# Patient Record
Sex: Female | Born: 1990 | Race: Black or African American | Hispanic: No | Marital: Single | Smoking: Current every day smoker
Health system: Southern US, Community
[De-identification: ages and names within clinical notes are randomized; demographics above are authoritative.]

---
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
## 2016-06-21 ENCOUNTER — Emergency Department (HOSPITAL_COMMUNITY): Payer: BLUE CROSS/BLUE SHIELD

## 2016-06-21 ENCOUNTER — Emergency Department (HOSPITAL_COMMUNITY)
Admission: EM | Admit: 2016-06-21 | Discharge: 2016-06-21 | Disposition: A | Discharge status: Home / Self Care | Payer: BLUE CROSS/BLUE SHIELD | Attending: Emergency Medicine | Admitting: Emergency Medicine

## 2016-06-21 ENCOUNTER — Encounter (HOSPITAL_COMMUNITY): Payer: Self-pay | Admitting: *Deleted

## 2016-06-21 DIAGNOSIS — Y999 Unspecified external cause status: Secondary | ICD-10-CM | POA: Diagnosis not present

## 2016-06-21 DIAGNOSIS — M546 Pain in thoracic spine: Secondary | ICD-10-CM | POA: Insufficient documentation

## 2016-06-21 DIAGNOSIS — Y92410 Unspecified street and highway as the place of occurrence of the external cause: Secondary | ICD-10-CM | POA: Insufficient documentation

## 2016-06-21 DIAGNOSIS — S8012XA Contusion of left lower leg, initial encounter: Secondary | ICD-10-CM | POA: Insufficient documentation

## 2016-06-21 DIAGNOSIS — Y939 Activity, unspecified: Secondary | ICD-10-CM | POA: Insufficient documentation

## 2016-06-21 DIAGNOSIS — S8992XA Unspecified injury of left lower leg, initial encounter: Secondary | ICD-10-CM | POA: Diagnosis present

## 2016-06-21 MED ORDER — OXYCODONE-ACETAMINOPHEN 5-325 MG PO TABS
2.0000 | ORAL_TABLET | Freq: Once | ORAL | Status: AC
  Administered 2016-06-21: 2 via ORAL
  Filled 2016-06-21: qty 2
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
## 2016-06-21 NOTE — Discharge Instructions (Signed)
Please take ibuprofen 600 mg three times per day for 2-4 days.  Recheck with your doctor this week if symptoms have not resolved.
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
## 2016-06-23 ENCOUNTER — Emergency Department (HOSPITAL_COMMUNITY): Payer: BLUE CROSS/BLUE SHIELD

## 2016-06-23 ENCOUNTER — Emergency Department (HOSPITAL_COMMUNITY)
Admission: EM | Admit: 2016-06-23 | Discharge: 2016-06-23 | Disposition: A | Discharge status: Home / Self Care | Payer: BLUE CROSS/BLUE SHIELD | Attending: Emergency Medicine | Admitting: Emergency Medicine

## 2016-06-23 ENCOUNTER — Encounter (HOSPITAL_COMMUNITY): Payer: Self-pay | Admitting: Emergency Medicine

## 2016-06-23 DIAGNOSIS — M25511 Pain in right shoulder: Secondary | ICD-10-CM | POA: Insufficient documentation

## 2016-06-23 DIAGNOSIS — S7012XA Contusion of left thigh, initial encounter: Secondary | ICD-10-CM | POA: Insufficient documentation

## 2016-06-23 DIAGNOSIS — Y92410 Unspecified street and highway as the place of occurrence of the external cause: Secondary | ICD-10-CM | POA: Diagnosis not present

## 2016-06-23 DIAGNOSIS — Y999 Unspecified external cause status: Secondary | ICD-10-CM | POA: Diagnosis not present

## 2016-06-23 DIAGNOSIS — S7010XA Contusion of unspecified thigh, initial encounter: Secondary | ICD-10-CM

## 2016-06-23 DIAGNOSIS — Y939 Activity, unspecified: Secondary | ICD-10-CM | POA: Diagnosis not present

## 2016-06-23 DIAGNOSIS — M542 Cervicalgia: Secondary | ICD-10-CM | POA: Insufficient documentation

## 2016-06-23 DIAGNOSIS — S79922A Unspecified injury of left thigh, initial encounter: Secondary | ICD-10-CM | POA: Diagnosis present

## 2016-06-23 DIAGNOSIS — S8010XA Contusion of unspecified lower leg, initial encounter: Secondary | ICD-10-CM

## 2016-06-23 LAB — POC URINE PREG, ED: Preg Test, Ur: NEGATIVE

## 2016-06-23 MED ORDER — NAPROXEN 375 MG PO TABS: 375.0000 mg | ORAL_TABLET | Freq: Two times a day (BID) | ORAL | 0 refills | Status: DC

## 2016-06-23 MED ORDER — METHOCARBAMOL 500 MG PO TABS: 500.0000 mg | ORAL_TABLET | Freq: Two times a day (BID) | ORAL | 0 refills | Status: DC
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
## 2016-06-23 NOTE — ED Notes (Signed)
See EDP's secondary assessment.
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
## 2016-06-23 NOTE — ED Notes (Signed)
Patient transported to X-ray 

## 2016-07-21 ENCOUNTER — Ambulatory Visit (HOSPITAL_COMMUNITY)
Admission: EM | Admit: 2016-07-21 | Discharge: 2016-07-21 | Disposition: A | Discharge status: Home / Self Care | Payer: BLUE CROSS/BLUE SHIELD | Attending: Family Medicine | Admitting: Family Medicine

## 2016-07-21 ENCOUNTER — Encounter (HOSPITAL_COMMUNITY): Payer: Self-pay | Admitting: Emergency Medicine

## 2016-07-21 DIAGNOSIS — M546 Pain in thoracic spine: Secondary | ICD-10-CM | POA: Diagnosis not present

## 2016-07-21 MED ORDER — METHOCARBAMOL 500 MG PO TABS: 500.0000 mg | ORAL_TABLET | Freq: Two times a day (BID) | ORAL | 0 refills | Status: DC

## 2016-07-21 MED ORDER — PREDNISONE 10 MG (21) PO TBPK: ORAL_TABLET | Freq: Every day | ORAL | 0 refills | Status: DC
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
## 2016-07-21 NOTE — Discharge Instructions (Signed)
Based on your signs, symptoms, and physical exam findings,you may at some point and near future need an MRI. For your condition, this can only be ordered either by a primary care provider, an orthopedist, or a neurologist. I provided the contact information for [facility], I recommend you contact them in the morning to schedule an appointment for primary care. You may also follow up with an orthopedist as well for your pain.

## 2017-10-29 ENCOUNTER — Other Ambulatory Visit: Payer: Self-pay

## 2017-10-29 ENCOUNTER — Encounter (HOSPITAL_COMMUNITY): Payer: Self-pay | Admitting: Emergency Medicine

## 2017-10-29 ENCOUNTER — Emergency Department (HOSPITAL_COMMUNITY): Payer: No Typology Code available for payment source

## 2017-10-29 ENCOUNTER — Emergency Department (HOSPITAL_COMMUNITY)
Admission: EM | Admit: 2017-10-29 | Discharge: 2017-10-30 | Disposition: A | Discharge status: Home / Self Care | Payer: No Typology Code available for payment source | Attending: Emergency Medicine | Admitting: Emergency Medicine

## 2017-10-29 DIAGNOSIS — Y939 Activity, unspecified: Secondary | ICD-10-CM | POA: Diagnosis not present

## 2017-10-29 DIAGNOSIS — S161XXA Strain of muscle, fascia and tendon at neck level, initial encounter: Secondary | ICD-10-CM | POA: Diagnosis not present

## 2017-10-29 DIAGNOSIS — R51 Headache: Secondary | ICD-10-CM | POA: Diagnosis present

## 2017-10-29 DIAGNOSIS — S93402A Sprain of unspecified ligament of left ankle, initial encounter: Secondary | ICD-10-CM | POA: Diagnosis not present

## 2017-10-29 DIAGNOSIS — Y999 Unspecified external cause status: Secondary | ICD-10-CM | POA: Diagnosis not present

## 2017-10-29 DIAGNOSIS — M549 Dorsalgia, unspecified: Secondary | ICD-10-CM | POA: Diagnosis not present

## 2017-10-29 DIAGNOSIS — Y929 Unspecified place or not applicable: Secondary | ICD-10-CM | POA: Diagnosis not present

## 2017-10-29 MED ORDER — ACETAMINOPHEN 325 MG PO TABS
650.0000 mg | ORAL_TABLET | Freq: Once | ORAL | Status: AC
  Administered 2017-10-30: 650 mg via ORAL
  Filled 2017-10-29: qty 2

## 2017-10-29 MED ORDER — METHOCARBAMOL 500 MG PO TABS
500.0000 mg | ORAL_TABLET | Freq: Once | ORAL | Status: AC
  Administered 2017-10-30: 500 mg via ORAL
  Filled 2017-10-29: qty 1
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
## 2017-10-29 NOTE — ED Notes (Signed)
Patient transported to CT 

## 2017-10-30 MED ORDER — ACETAMINOPHEN 325 MG PO TABS
650.0000 mg | ORAL_TABLET | Freq: Four times a day (QID) | ORAL | 0 refills | Status: AC | PRN
Start: 2017-10-30 — End: ?

## 2017-10-30 MED ORDER — METHOCARBAMOL 500 MG PO TABS: 500.0000 mg | ORAL_TABLET | Freq: Two times a day (BID) | ORAL | 0 refills | Status: DC
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
## 2018-10-28 ENCOUNTER — Other Ambulatory Visit: Payer: Self-pay

## 2018-10-28 DIAGNOSIS — Z20822 Contact with and (suspected) exposure to covid-19: Secondary | ICD-10-CM

## 2018-10-29 LAB — NOVEL CORONAVIRUS, NAA: SARS-CoV-2, NAA: NOT DETECTED

## 2020-12-04 DIAGNOSIS — F17210 Nicotine dependence, cigarettes, uncomplicated: Secondary | ICD-10-CM | POA: Diagnosis not present

## 2020-12-04 DIAGNOSIS — S301XXA Contusion of abdominal wall, initial encounter: Secondary | ICD-10-CM | POA: Diagnosis not present

## 2020-12-04 DIAGNOSIS — Y92410 Unspecified street and highway as the place of occurrence of the external cause: Secondary | ICD-10-CM | POA: Insufficient documentation

## 2020-12-04 DIAGNOSIS — M25512 Pain in left shoulder: Secondary | ICD-10-CM | POA: Insufficient documentation

## 2020-12-04 DIAGNOSIS — S8992XA Unspecified injury of left lower leg, initial encounter: Secondary | ICD-10-CM | POA: Diagnosis present

## 2020-12-04 DIAGNOSIS — M542 Cervicalgia: Secondary | ICD-10-CM | POA: Diagnosis not present

## 2020-12-04 DIAGNOSIS — R079 Chest pain, unspecified: Secondary | ICD-10-CM | POA: Diagnosis not present

## 2020-12-04 DIAGNOSIS — N9489 Other specified conditions associated with female genital organs and menstrual cycle: Secondary | ICD-10-CM | POA: Diagnosis not present

## 2020-12-04 DIAGNOSIS — S8002XA Contusion of left knee, initial encounter: Secondary | ICD-10-CM | POA: Diagnosis not present

## 2020-12-05 ENCOUNTER — Emergency Department (HOSPITAL_COMMUNITY): Payer: No Typology Code available for payment source

## 2020-12-05 ENCOUNTER — Encounter (HOSPITAL_COMMUNITY): Payer: Self-pay | Admitting: Emergency Medicine

## 2020-12-05 ENCOUNTER — Emergency Department (HOSPITAL_COMMUNITY)
Admission: EM | Admit: 2020-12-05 | Discharge: 2020-12-05 | Disposition: A | Discharge status: Home / Self Care | Payer: No Typology Code available for payment source | Attending: Emergency Medicine | Admitting: Emergency Medicine

## 2020-12-05 DIAGNOSIS — D509 Iron deficiency anemia, unspecified: Secondary | ICD-10-CM

## 2020-12-05 DIAGNOSIS — T07XXXA Unspecified multiple injuries, initial encounter: Secondary | ICD-10-CM

## 2020-12-05 LAB — CBC WITH DIFFERENTIAL/PLATELET
Abs Immature Granulocytes: 0.04 10*3/uL (ref 0.00–0.07)
Basophils Absolute: 0.1 10*3/uL (ref 0.0–0.1)
Basophils Relative: 1 %
Eosinophils Absolute: 0.2 10*3/uL (ref 0.0–0.5)
Eosinophils Relative: 2 %
HCT: 29.1 % — ABNORMAL LOW (ref 36.0–46.0)
Hemoglobin: 8.6 g/dL — ABNORMAL LOW (ref 12.0–15.0)
Immature Granulocytes: 0 %
Lymphocytes Relative: 26 %
Lymphs Abs: 2.5 10*3/uL (ref 0.7–4.0)
MCH: 21.8 pg — ABNORMAL LOW (ref 26.0–34.0)
MCHC: 29.6 g/dL — ABNORMAL LOW (ref 30.0–36.0)
MCV: 73.7 fL — ABNORMAL LOW (ref 80.0–100.0)
Monocytes Absolute: 0.6 10*3/uL (ref 0.1–1.0)
Monocytes Relative: 6 %
Neutro Abs: 6.3 10*3/uL (ref 1.7–7.7)
Neutrophils Relative %: 65 %
Platelets: 407 10*3/uL — ABNORMAL HIGH (ref 150–400)
RBC: 3.95 MIL/uL (ref 3.87–5.11)
RDW: 27.2 % — ABNORMAL HIGH (ref 11.5–15.5)
WBC: 9.7 10*3/uL (ref 4.0–10.5)
nRBC: 0 % (ref 0.0–0.2)

## 2020-12-05 LAB — BASIC METABOLIC PANEL
Anion gap: 10 (ref 5–15)
BUN: 12 mg/dL (ref 6–20)
CO2: 24 mmol/L (ref 22–32)
Calcium: 9.4 mg/dL (ref 8.9–10.3)
Chloride: 100 mmol/L (ref 98–111)
Creatinine, Ser: 0.88 mg/dL (ref 0.44–1.00)
GFR, Estimated: >60 mL/min (ref >60)
Glucose, Bld: 104 mg/dL — ABNORMAL HIGH (ref 70–99)
Potassium: 3.7 mmol/L (ref 3.5–5.1)
Sodium: 134 mmol/L — ABNORMAL LOW (ref 135–145)

## 2020-12-05 LAB — HEMOGLOBIN AND HEMATOCRIT, BLOOD
HCT: 27.6 % — ABNORMAL LOW (ref 36.0–46.0)
Hemoglobin: 8.1 g/dL — ABNORMAL LOW (ref 12.0–15.0)

## 2020-12-05 LAB — HCG, QUANTITATIVE, PREGNANCY: hCG, Beta Chain, Quant, S: 1 m[IU]/mL (ref <5)

## 2020-12-05 MED ORDER — SODIUM CHLORIDE (PF) 0.9 % IJ SOLN
INTRAMUSCULAR | Status: AC
  Filled 2020-12-05: qty 50

## 2020-12-05 MED ORDER — MORPHINE SULFATE (PF) 4 MG/ML IV SOLN
4.0000 mg | Freq: Once | INTRAVENOUS | Status: AC
  Administered 2020-12-05: 4 mg via INTRAVENOUS
  Filled 2020-12-05: qty 1

## 2020-12-05 MED ORDER — OXYCODONE-ACETAMINOPHEN 5-325 MG PO TABS
1.0000 | ORAL_TABLET | Freq: Once | ORAL | Status: AC
  Administered 2020-12-05: 1 via ORAL
  Filled 2020-12-05: qty 1

## 2020-12-05 MED ORDER — IOHEXOL 350 MG/ML SOLN
100.0000 mL | Freq: Once | INTRAVENOUS | Status: AC | PRN
  Administered 2020-12-05: 80 mL via INTRAVENOUS

## 2020-12-05 MED ORDER — IBUPROFEN 600 MG PO TABS: 600.0000 mg | ORAL_TABLET | Freq: Four times a day (QID) | ORAL | 0 refills | Status: AC | PRN

## 2020-12-05 MED ORDER — CYCLOBENZAPRINE HCL 10 MG PO TABS: 10.0000 mg | ORAL_TABLET | Freq: Two times a day (BID) | ORAL | 0 refills | Status: AC | PRN

## 2020-12-05 MED ORDER — FERROUS SULFATE 325 (65 FE) MG PO TABS: 325.0000 mg | ORAL_TABLET | Freq: Every day | ORAL | 0 refills | Status: AC

## 2020-12-05 MED ORDER — VITAMIN C 250 MG PO TABS: 250.0000 mg | ORAL_TABLET | Freq: Every day | ORAL | 0 refills | Status: AC
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
## 2020-12-19 ENCOUNTER — Telehealth: Payer: Self-pay

## 2020-12-19 ENCOUNTER — Ambulatory Visit
Admission: RE | Admit: 2020-12-19 | Discharge: 2020-12-19 | Disposition: A | Discharge status: Home / Self Care | Payer: Medicaid Other | Source: Ambulatory Visit | Attending: Emergency Medicine | Admitting: Emergency Medicine

## 2020-12-19 ENCOUNTER — Other Ambulatory Visit: Payer: Self-pay

## 2020-12-19 DIAGNOSIS — M62838 Other muscle spasm: Secondary | ICD-10-CM

## 2020-12-19 DIAGNOSIS — F4311 Post-traumatic stress disorder, acute: Secondary | ICD-10-CM

## 2020-12-19 DIAGNOSIS — D509 Iron deficiency anemia, unspecified: Secondary | ICD-10-CM

## 2020-12-19 DIAGNOSIS — N83209 Unspecified ovarian cyst, unspecified side: Secondary | ICD-10-CM

## 2020-12-19 DIAGNOSIS — F5105 Insomnia due to other mental disorder: Secondary | ICD-10-CM

## 2020-12-19 DIAGNOSIS — M25562 Pain in left knee: Secondary | ICD-10-CM

## 2020-12-19 DIAGNOSIS — M659 Synovitis and tenosynovitis, unspecified: Secondary | ICD-10-CM

## 2020-12-19 DIAGNOSIS — S301XXD Contusion of abdominal wall, subsequent encounter: Secondary | ICD-10-CM

## 2020-12-19 MED ORDER — METHYLPREDNISOLONE 4 MG PO TBPK: ORAL_TABLET | ORAL | 0 refills | Status: DC

## 2020-12-19 MED ORDER — BACLOFEN 10 MG PO TABS: 10.0000 mg | ORAL_TABLET | Freq: Three times a day (TID) | ORAL | 0 refills | Status: AC

## 2020-12-19 MED ORDER — KETOROLAC TROMETHAMINE 60 MG/2ML IM SOLN
60.0000 mg | Freq: Once | INTRAMUSCULAR | Status: AC
  Administered 2020-12-19: 60 mg via INTRAMUSCULAR

## 2020-12-19 MED ORDER — METHYLPREDNISOLONE 4 MG PO TBPK: ORAL_TABLET | ORAL | 0 refills | Status: AC

## 2020-12-19 MED ORDER — BACLOFEN 10 MG PO TABS: 10.0000 mg | ORAL_TABLET | Freq: Three times a day (TID) | ORAL | 0 refills | Status: DC
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
